# Patient Record
Sex: Male | Born: 1995 | Race: White | Hispanic: Yes | Marital: Single | Smoking: Never smoker
Health system: Southern US, Community
[De-identification: ages and names within clinical notes are randomized; demographics above are authoritative.]

## PROBLEM LIST (undated history)

## (undated) DIAGNOSIS — Q780 Osteogenesis imperfecta: Secondary | ICD-10-CM

## (undated) DIAGNOSIS — S83004A Unspecified dislocation of right patella, initial encounter: Secondary | ICD-10-CM

## (undated) DIAGNOSIS — S82209A Unspecified fracture of shaft of unspecified tibia, initial encounter for closed fracture: Secondary | ICD-10-CM

## (undated) DIAGNOSIS — S42409A Unspecified fracture of lower end of unspecified humerus, initial encounter for closed fracture: Secondary | ICD-10-CM

## (undated) DIAGNOSIS — R062 Wheezing: Secondary | ICD-10-CM

## (undated) DIAGNOSIS — S8264XA Nondisplaced fracture of lateral malleolus of right fibula, initial encounter for closed fracture: Secondary | ICD-10-CM

## (undated) DIAGNOSIS — Z8669 Personal history of other diseases of the nervous system and sense organs: Secondary | ICD-10-CM

## (undated) DIAGNOSIS — S82409A Unspecified fracture of shaft of unspecified fibula, initial encounter for closed fracture: Secondary | ICD-10-CM

## (undated) HISTORY — DX: Unspecified dislocation of right patella, initial encounter: S83.004A

## (undated) HISTORY — DX: Unspecified fracture of shaft of unspecified fibula, initial encounter for closed fracture: S82.409A

## (undated) HISTORY — DX: Personal history of other diseases of the nervous system and sense organs: Z86.69

## (undated) HISTORY — DX: Unspecified fracture of lower end of unspecified humerus, initial encounter for closed fracture: S42.409A

## (undated) HISTORY — DX: Nondisplaced fracture of lateral malleolus of right fibula, initial encounter for closed fracture: S82.64XA

## (undated) HISTORY — DX: Osteogenesis imperfecta: Q78.0

## (undated) HISTORY — DX: Wheezing: R06.2

## (undated) HISTORY — DX: Unspecified fracture of shaft of unspecified fibula, initial encounter for closed fracture: S82.209A

## (undated) HISTORY — PX: ORIF TIBIA & FIBULA FRACTURES: SHX2131

---
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
## 2017-01-11 HISTORY — PX: MEDIAL PATELLOFEMORAL LIGAMENT REPAIR: SHX2020

## 2017-01-17 ENCOUNTER — Encounter: Payer: Self-pay | Admitting: Family Medicine

## 2017-01-17 ENCOUNTER — Ambulatory Visit (INDEPENDENT_AMBULATORY_CARE_PROVIDER_SITE_OTHER)
Admission: RE | Admit: 2017-01-17 | Discharge: 2017-01-17 | Disposition: A | Discharge status: Home / Self Care | Payer: Commercial Managed Care - PPO | Source: Ambulatory Visit | Attending: Family Medicine | Admitting: Family Medicine

## 2017-01-17 ENCOUNTER — Other Ambulatory Visit: Payer: Self-pay

## 2017-01-17 ENCOUNTER — Ambulatory Visit: Payer: Commercial Managed Care - PPO | Admitting: Family Medicine

## 2017-01-17 VITALS — BP 110/72 | HR 84 | Temp 98.3°F | Ht 66.75 in | Wt 173.5 lb

## 2017-01-17 DIAGNOSIS — Q780 Osteogenesis imperfecta: Secondary | ICD-10-CM

## 2017-01-17 DIAGNOSIS — M25511 Pain in right shoulder: Secondary | ICD-10-CM

## 2017-01-17 NOTE — Progress Notes (Signed)
Dr. Karleen HampshireSpencer T. Dreshaun Stene, MD, CAQ Sports Medicine Primary Care and Sports Medicine [address] [place], [id number] Phone: [phone] Fax: [phone]  01/17/2017  Patient: Arthur Fleming, MRN: [id number], DOB: 04/02/1995, 22 y.o.  Primary Physician:  Hannah Beatopland, Joliana Claflin, MD   Chief Complaint  Patient presents with  . Shoulder Pain    Right-Power Lifter   Subjective:   Arthur Fleming is a 22 y.o. very pleasant male patient who presents with the following:  Started to lift - felt pain, bruise in posterior delt.  Overhead presses working well.  He only gave himself about 5 days off of pressing movements.  He subsequently developed pain and some bruising in his upper lap on the right side as well.  He has been able to continue to lift weights including dead lift, back squats, overhead presses, and almost everything except for frontal raises as well as bench press.  Past Medical History, Surgical History, Social History, Family History, Problem List, Medications, and Allergies have been reviewed and updated if relevant.  Patient Active Problem List   Diagnosis Date Noted  . 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 CLOSED FRACTURE OF SHAFT OF TIBIA 04/07/2009  . ALLERGIC RHINITIS 05/02/2008  . OPEN FRACTURE OF OLECRANON PROCESS OF ULNA 10/15/2007  . OSTEOPENIA 02/07/2007    Past Medical History:  Diagnosis Date  . Elbow fracture 2010  . H/O: chronic ear infection   . Osteogenesis imperfecta   . Tibia fracture 03-2009  . Tibia/fibula fracture    3,4  . Wheezing without diagnosis of asthma     Past Surgical History:  Procedure Laterality Date  . ELBOW FRACTURE SURGERY  10-16-2007   LEFT   . ORIF TIBIA & FIBULA FRACTURES     3 TIMES AS YOUNG CHILD (3)    Social History   Socioeconomic History  . Marital status: Single    Spouse name: Not on file  . Number of children: Not on file  . Years of education: Not on file  . Highest education level:  Not on file  Social Needs  . Financial resource strain: Not on file  . Food insecurity - worry: Not on file  . Food insecurity - inability: Not on file  . Transportation needs - medical: Not on file  . Transportation needs - non-medical: Not on file  Occupational History  . Not on file  Tobacco Use  . Smoking status: Never Smoker  . Smokeless tobacco: Never Used  Substance and Sexual Activity  . Alcohol use: No    Alcohol/week: 0.0 oz  . Drug use: No  . Sexual activity: Not on file  Other Topics Concern  . Not on file  Social History Narrative   Lives with adopted parents, he is a native of [place], adopted at 224 [place] 1/2 months    No family history on file.  Allergies  Allergen Reactions  . Amoxicillin-Pot Clavulanate     REACTION: diarrhea    Medication list reviewed and updated in full in [facility] [facility].  GEN: No fevers, chills. Nontoxic. Primarily MSK c/o today. MSK: Detailed in the HPI GI: tolerating PO intake without difficulty Neuro: No numbness, parasthesias, or tingling associated. Otherwise the pertinent positives of the ROS are noted above.   Objective:   BP 110/72   Pulse 84   Temp 98.3 F (36.8 C) (Oral)   Ht 5' 6.75" (1.695 m)   Wt 173 lb 8 oz (78.7 kg)  BMI 27.38 kg/m    GEN: WDWN, NAD, Non-toxic, Alert & Oriented x 3 HEENT: Atraumatic, Normocephalic.  Ears and Nose: No external deformity. EXTR: No clubbing/cyanosis/edema NEURO: Normal gait.  PSYCH: Normally interactive. Conversant. Not depressed or anxious appearing.  Calm demeanor.   Shoulder: R Inspection: No muscle wasting or winging Ecchymosis/edema: neg  AC joint, scapula, clavicle: mild ttp at ac joint Cervical spine: NT, full ROM Spurling's: neg Abduction: full, 5/5 Flexion: full, 4+/5 and causes some pain IR, full, lift-off: 5/5 ER at neutral: full, 5/5 AC crossover and compression: neg Neer: neg Hawkins: neg Drop Test: neg Empty Can: neg Supraspinatus insertion:  NT Bicipital groove: NT Speed's: neg Yergason's: neg Sulcus sign: neg Scapular dyskinesis: none C5-T1 intact Sensation intact Grip 5/5   Radiology: Dg Shoulder Right  Result Date: 01/17/2017 CLINICAL DATA:  Right shoulder pain near the [facility] joint following lifting injury 1 month ago. EXAM: RIGHT SHOULDER - 2+ VIEW COMPARISON:  None. FINDINGS: The mineralization and alignment are normal. There is no evidence of acute fracture or dislocation. Possible minimal lucency in the distal clavicle without AC joint widening or surrounding soft tissue abnormality. The subacromial space is preserved. IMPRESSION: Possible minimal lucency in the distal clavicle which could reflect posttraumatic osteolysis. No AC joint widening or acute fracture. Electronically Signed   By: Carey Bullocks M.D.   On: 01/17/2017 16:24     Assessment and Plan:   Acute pain of right shoulder - Plan: DG Shoulder Right  Osteogenesis imperfecta - Plan: DG Shoulder Right  >25 minutes spent in face to face time with patient, >50% spent in counselling or coordination of care   The patient is lifting very heavy weights.  He has done well and not had fractures for many years.  I verbally discussed the case with radiology, and they do not think that there is any sign for acute or prior fracture.  I suspect he had an injury followed by another injury where he pulled his upper LAT and then subsequently strained his AC joint.  He has been improving, but I think you return to sport too early.  Recommend no pressing movements for at least 2 weeks, then gradual return with lower weights as pain allows.  Other weight lifting movements can be done depending on if they induce pain.  He understands.  Follow-up: No Follow-up on file.  Medications Discontinued During This Encounter  Medication Reason  . loratadine (CLARITIN) 10 MG tablet Completed Course   Orders Placed This Encounter  Procedures  . DG Shoulder Right     Signed,  Karleen Hampshire T. Nile Prisk, MD   Allergies as of 01/17/2017      Reactions   Amoxicillin-pot Clavulanate    REACTION: diarrhea      Medication List    as of 01/17/2017 11:59 PM   You have not been prescribed any medications.

## 2017-01-18 ENCOUNTER — Encounter: Payer: Self-pay | Admitting: Family Medicine

## 2017-02-17 DIAGNOSIS — J101 Influenza due to other identified influenza virus with other respiratory manifestations: Secondary | ICD-10-CM | POA: Diagnosis not present

## 2017-02-17 DIAGNOSIS — R6883 Chills (without fever): Secondary | ICD-10-CM | POA: Diagnosis not present

## 2017-07-10 DIAGNOSIS — M25561 Pain in right knee: Secondary | ICD-10-CM | POA: Diagnosis not present

## 2017-07-10 DIAGNOSIS — M25461 Effusion, right knee: Secondary | ICD-10-CM | POA: Diagnosis not present

## 2017-07-10 DIAGNOSIS — S8991XA Unspecified injury of right lower leg, initial encounter: Secondary | ICD-10-CM | POA: Diagnosis not present

## 2017-07-10 DIAGNOSIS — S8981XA Other specified injuries of right lower leg, initial encounter: Secondary | ICD-10-CM | POA: Diagnosis not present

## 2017-10-09 DIAGNOSIS — S82831A Other fracture of upper and lower end of right fibula, initial encounter for closed fracture: Secondary | ICD-10-CM | POA: Diagnosis not present

## 2017-10-09 DIAGNOSIS — M25461 Effusion, right knee: Secondary | ICD-10-CM | POA: Diagnosis not present

## 2017-10-09 DIAGNOSIS — R296 Repeated falls: Secondary | ICD-10-CM | POA: Diagnosis not present

## 2017-10-09 DIAGNOSIS — S8391XA Sprain of unspecified site of right knee, initial encounter: Secondary | ICD-10-CM | POA: Diagnosis not present

## 2017-10-09 DIAGNOSIS — S8261XA Displaced fracture of lateral malleolus of right fibula, initial encounter for closed fracture: Secondary | ICD-10-CM | POA: Diagnosis not present

## 2017-10-10 ENCOUNTER — Other Ambulatory Visit: Payer: Self-pay

## 2017-10-10 MED ORDER — ACETAMINOPHEN-CODEINE #3 300-30 MG PO TABS: 1.0000 | ORAL_TABLET | Freq: Four times a day (QID) | ORAL | 0 refills | Status: DC | PRN

## 2017-10-10 NOTE — Telephone Encounter (Signed)
Copied from CRM [id number]. Topic: General - Other >> Oct 10, 2017  1:17 PM Gaynelle Adu wrote: Reason for CRM: patient  mother is calling to advise the patient was seen at the ER yesterday morning, he is pain and  [place] of [place] in [place]. His father is going to pick him up today.  They will be taking him to [facility] to be seen. She is requesting some ype of pain medication until he can be seen.   [facility] [address] [place] - [address] [phone] (Phone) [phone] (Fax)    Best contact number [phone].

## 2017-10-10 NOTE — Telephone Encounter (Signed)
I spoke with pts mom; pt having pain in lt knee; fell while walking dog and seen in [place] ED; pt was told to take tylenol and see ortho by Tues. pts father is picking up pt now; lt knee is very swollen and bruised and request pain med stronger than tylenol until can see ortho at [facility]. pts mom said will go to [facility] or ED if needed this evening. pts mom request cb[facility] [place] Pts mom request cb. Pt last seen 01/17/17.

## 2017-10-10 NOTE — Telephone Encounter (Signed)
Rawn has OI. Concern for fracture.  Agree with [facility]  Tylenol #3, 1 po q 4-6 hours prn pain, #20, 0 ref  This is the same as the liquid he used to get when he was younger

## 2017-10-10 NOTE — Telephone Encounter (Signed)
Mrs. Persing notified by telephone that Dr. Patsy Lager has sent Casimiro Needle in a [facility] [place]  She said she would keep [place] posted with what they find out tomorrow at Ortho.

## 2017-10-11 ENCOUNTER — Telehealth: Payer: Self-pay

## 2017-10-11 DIAGNOSIS — S8264XA Nondisplaced fracture of lateral malleolus of right fibula, initial encounter for closed fracture: Secondary | ICD-10-CM | POA: Diagnosis not present

## 2017-10-11 DIAGNOSIS — M25579 Pain in unspecified ankle and joints of unspecified foot: Secondary | ICD-10-CM | POA: Diagnosis not present

## 2017-10-11 DIAGNOSIS — M25571 Pain in right ankle and joints of right foot: Secondary | ICD-10-CM | POA: Diagnosis not present

## 2017-10-11 DIAGNOSIS — M25569 Pain in unspecified knee: Secondary | ICD-10-CM | POA: Diagnosis not present

## 2017-10-11 DIAGNOSIS — S83011A Lateral subluxation of right patella, initial encounter: Secondary | ICD-10-CM | POA: Diagnosis not present

## 2017-10-11 DIAGNOSIS — M25461 Effusion, right knee: Secondary | ICD-10-CM | POA: Diagnosis not present

## 2017-10-11 DIAGNOSIS — S82891A Other fracture of right lower leg, initial encounter for closed fracture: Secondary | ICD-10-CM | POA: Diagnosis not present

## 2017-10-11 DIAGNOSIS — S8991XA Unspecified injury of right lower leg, initial encounter: Secondary | ICD-10-CM | POA: Diagnosis not present

## 2017-10-11 NOTE — Telephone Encounter (Signed)
Copied from CRM [id number]. Topic: Quick Communication - See Telephone Encounter >> Oct 11, 2017  4:06 PM Herby Abraham C wrote: CRM for notification. See Telephone encounter for: 10/11/17.   Pt's mom called in to make provider aware that pt was seen by ortho today and pt has fracture on ankle and knee. [facility] would like to see pt again tomorrow. [facility]  prescribed oxy for pt.

## 2017-10-12 DIAGNOSIS — M25561 Pain in right knee: Secondary | ICD-10-CM | POA: Diagnosis not present

## 2017-10-12 DIAGNOSIS — M948X6 Other specified disorders of cartilage, lower leg: Secondary | ICD-10-CM | POA: Diagnosis not present

## 2017-10-12 DIAGNOSIS — S83011A Lateral subluxation of right patella, initial encounter: Secondary | ICD-10-CM | POA: Diagnosis not present

## 2017-10-12 NOTE — Telephone Encounter (Signed)
ok 

## 2017-10-14 DIAGNOSIS — S83001A Unspecified subluxation of right patella, initial encounter: Secondary | ICD-10-CM | POA: Diagnosis not present

## 2017-10-14 DIAGNOSIS — M25461 Effusion, right knee: Secondary | ICD-10-CM | POA: Diagnosis not present

## 2017-10-20 DIAGNOSIS — S83004D Unspecified dislocation of right patella, subsequent encounter: Secondary | ICD-10-CM | POA: Diagnosis not present

## 2017-10-20 DIAGNOSIS — S8264XA Nondisplaced fracture of lateral malleolus of right fibula, initial encounter for closed fracture: Secondary | ICD-10-CM | POA: Diagnosis not present

## 2017-10-20 DIAGNOSIS — S8264XD Nondisplaced fracture of lateral malleolus of right fibula, subsequent encounter for closed fracture with routine healing: Secondary | ICD-10-CM | POA: Diagnosis not present

## 2017-10-20 DIAGNOSIS — S8261XA Displaced fracture of lateral malleolus of right fibula, initial encounter for closed fracture: Secondary | ICD-10-CM | POA: Diagnosis not present

## 2017-10-25 NOTE — Telephone Encounter (Signed)
Mom called to advise pt is having knee surgery this Thursday 10/27/17 at [facility] w/ Dr Ivin Poot. Wanted Dr Patsy Lager know.

## 2017-10-25 NOTE — Telephone Encounter (Signed)
Thanks for the update

## 2017-10-27 DIAGNOSIS — S83004A Unspecified dislocation of right patella, initial encounter: Secondary | ICD-10-CM | POA: Diagnosis not present

## 2017-10-27 DIAGNOSIS — S83006A Unspecified dislocation of unspecified patella, initial encounter: Secondary | ICD-10-CM | POA: Diagnosis not present

## 2017-10-27 DIAGNOSIS — Z880 Allergy status to penicillin: Secondary | ICD-10-CM | POA: Diagnosis not present

## 2017-10-28 DIAGNOSIS — S83004A Unspecified dislocation of right patella, initial encounter: Secondary | ICD-10-CM | POA: Diagnosis not present

## 2017-10-28 DIAGNOSIS — Z880 Allergy status to penicillin: Secondary | ICD-10-CM | POA: Diagnosis not present

## 2017-11-03 DIAGNOSIS — S83001A Unspecified subluxation of right patella, initial encounter: Secondary | ICD-10-CM | POA: Diagnosis not present

## 2017-11-04 ENCOUNTER — Telehealth: Payer: Self-pay

## 2017-11-04 DIAGNOSIS — S82899S Other fracture of unspecified lower leg, sequela: Secondary | ICD-10-CM

## 2017-11-04 NOTE — Telephone Encounter (Signed)
Copied from CRM [id number]. Topic: Referral - Request for Referral >> Nov 04, 2017  9:43 AM Arlyss Gandy, NT wrote: Has patient seen PCP for this complaint? Yes.   *If NO, is insurance requiring patient see PCP for this issue before PCP can refer them? Referral for which specialty: physical therapy Preferred provider/office: unsure but requesting somewhere in [place] Reason for referral: For ankle fractures and knee surgery

## 2017-11-07 ENCOUNTER — Telehealth: Payer: Self-pay

## 2017-11-07 NOTE — Telephone Encounter (Signed)
Called Arthur Fleming back and left her my direct phone number to call me back.

## 2017-11-07 NOTE — Telephone Encounter (Signed)
Copied from CRM [id number]. Topic: Quick Communication - See Telephone Encounter >> Nov 07, 2017  9:24 AM Aretta Nip wrote: CRM for notification. See Telephone encounter for: 11/07/17. Mother of patient was speaking with Danella Maiers this morning about a referral for Arthur Fleming, lost her info about the time and place, address of the appointment.

## 2017-11-11 DIAGNOSIS — M25661 Stiffness of right knee, not elsewhere classified: Secondary | ICD-10-CM | POA: Diagnosis not present

## 2017-11-11 DIAGNOSIS — M25671 Stiffness of right ankle, not elsewhere classified: Secondary | ICD-10-CM | POA: Diagnosis not present

## 2017-11-11 DIAGNOSIS — M6281 Muscle weakness (generalized): Secondary | ICD-10-CM | POA: Diagnosis not present

## 2017-11-17 DIAGNOSIS — M25661 Stiffness of right knee, not elsewhere classified: Secondary | ICD-10-CM | POA: Diagnosis not present

## 2017-11-17 DIAGNOSIS — M25671 Stiffness of right ankle, not elsewhere classified: Secondary | ICD-10-CM | POA: Diagnosis not present

## 2017-11-17 DIAGNOSIS — M6281 Muscle weakness (generalized): Secondary | ICD-10-CM | POA: Diagnosis not present

## 2017-11-23 DIAGNOSIS — M6281 Muscle weakness (generalized): Secondary | ICD-10-CM | POA: Diagnosis not present

## 2017-11-23 DIAGNOSIS — M25671 Stiffness of right ankle, not elsewhere classified: Secondary | ICD-10-CM | POA: Diagnosis not present

## 2017-11-23 DIAGNOSIS — M25661 Stiffness of right knee, not elsewhere classified: Secondary | ICD-10-CM | POA: Diagnosis not present

## 2017-11-25 DIAGNOSIS — M25661 Stiffness of right knee, not elsewhere classified: Secondary | ICD-10-CM | POA: Diagnosis not present

## 2017-11-25 DIAGNOSIS — M25671 Stiffness of right ankle, not elsewhere classified: Secondary | ICD-10-CM | POA: Diagnosis not present

## 2017-11-25 DIAGNOSIS — M6281 Muscle weakness (generalized): Secondary | ICD-10-CM | POA: Diagnosis not present

## 2017-11-28 DIAGNOSIS — Z9889 Other specified postprocedural states: Secondary | ICD-10-CM | POA: Diagnosis not present

## 2017-11-28 DIAGNOSIS — S83004D Unspecified dislocation of right patella, subsequent encounter: Secondary | ICD-10-CM | POA: Diagnosis not present

## 2017-11-28 DIAGNOSIS — S8261XD Displaced fracture of lateral malleolus of right fibula, subsequent encounter for closed fracture with routine healing: Secondary | ICD-10-CM | POA: Diagnosis not present

## 2017-11-28 DIAGNOSIS — S83004A Unspecified dislocation of right patella, initial encounter: Secondary | ICD-10-CM | POA: Diagnosis not present

## 2017-11-28 DIAGNOSIS — S8264XA Nondisplaced fracture of lateral malleolus of right fibula, initial encounter for closed fracture: Secondary | ICD-10-CM | POA: Diagnosis not present

## 2017-11-28 DIAGNOSIS — S8264XD Nondisplaced fracture of lateral malleolus of right fibula, subsequent encounter for closed fracture with routine healing: Secondary | ICD-10-CM | POA: Diagnosis not present

## 2017-11-29 DIAGNOSIS — M6281 Muscle weakness (generalized): Secondary | ICD-10-CM | POA: Diagnosis not present

## 2017-11-29 DIAGNOSIS — M25671 Stiffness of right ankle, not elsewhere classified: Secondary | ICD-10-CM | POA: Diagnosis not present

## 2017-11-29 DIAGNOSIS — M25661 Stiffness of right knee, not elsewhere classified: Secondary | ICD-10-CM | POA: Diagnosis not present

## 2017-12-01 DIAGNOSIS — M25671 Stiffness of right ankle, not elsewhere classified: Secondary | ICD-10-CM | POA: Diagnosis not present

## 2017-12-01 DIAGNOSIS — M6281 Muscle weakness (generalized): Secondary | ICD-10-CM | POA: Diagnosis not present

## 2017-12-01 DIAGNOSIS — M25661 Stiffness of right knee, not elsewhere classified: Secondary | ICD-10-CM | POA: Diagnosis not present

## 2017-12-07 DIAGNOSIS — M25671 Stiffness of right ankle, not elsewhere classified: Secondary | ICD-10-CM | POA: Diagnosis not present

## 2017-12-07 DIAGNOSIS — M25661 Stiffness of right knee, not elsewhere classified: Secondary | ICD-10-CM | POA: Diagnosis not present

## 2017-12-07 DIAGNOSIS — M6281 Muscle weakness (generalized): Secondary | ICD-10-CM | POA: Diagnosis not present

## 2017-12-12 DIAGNOSIS — M6281 Muscle weakness (generalized): Secondary | ICD-10-CM | POA: Diagnosis not present

## 2017-12-12 DIAGNOSIS — M25661 Stiffness of right knee, not elsewhere classified: Secondary | ICD-10-CM | POA: Diagnosis not present

## 2017-12-12 DIAGNOSIS — M25671 Stiffness of right ankle, not elsewhere classified: Secondary | ICD-10-CM | POA: Diagnosis not present

## 2017-12-14 DIAGNOSIS — M25671 Stiffness of right ankle, not elsewhere classified: Secondary | ICD-10-CM | POA: Diagnosis not present

## 2017-12-14 DIAGNOSIS — M25661 Stiffness of right knee, not elsewhere classified: Secondary | ICD-10-CM | POA: Diagnosis not present

## 2017-12-14 DIAGNOSIS — M6281 Muscle weakness (generalized): Secondary | ICD-10-CM | POA: Diagnosis not present

## 2017-12-21 DIAGNOSIS — M25671 Stiffness of right ankle, not elsewhere classified: Secondary | ICD-10-CM | POA: Diagnosis not present

## 2017-12-21 DIAGNOSIS — M25661 Stiffness of right knee, not elsewhere classified: Secondary | ICD-10-CM | POA: Diagnosis not present

## 2017-12-21 DIAGNOSIS — M6281 Muscle weakness (generalized): Secondary | ICD-10-CM | POA: Diagnosis not present

## 2017-12-23 DIAGNOSIS — M25661 Stiffness of right knee, not elsewhere classified: Secondary | ICD-10-CM | POA: Diagnosis not present

## 2017-12-23 DIAGNOSIS — M6281 Muscle weakness (generalized): Secondary | ICD-10-CM | POA: Diagnosis not present

## 2017-12-23 DIAGNOSIS — M25671 Stiffness of right ankle, not elsewhere classified: Secondary | ICD-10-CM | POA: Diagnosis not present

## 2017-12-26 DIAGNOSIS — M25661 Stiffness of right knee, not elsewhere classified: Secondary | ICD-10-CM | POA: Diagnosis not present

## 2017-12-26 DIAGNOSIS — M6281 Muscle weakness (generalized): Secondary | ICD-10-CM | POA: Diagnosis not present

## 2017-12-26 DIAGNOSIS — M25671 Stiffness of right ankle, not elsewhere classified: Secondary | ICD-10-CM | POA: Diagnosis not present

## 2017-12-28 DIAGNOSIS — M25671 Stiffness of right ankle, not elsewhere classified: Secondary | ICD-10-CM | POA: Diagnosis not present

## 2017-12-28 DIAGNOSIS — M6281 Muscle weakness (generalized): Secondary | ICD-10-CM | POA: Diagnosis not present

## 2017-12-28 DIAGNOSIS — M25661 Stiffness of right knee, not elsewhere classified: Secondary | ICD-10-CM | POA: Diagnosis not present

## 2018-01-03 DIAGNOSIS — M25661 Stiffness of right knee, not elsewhere classified: Secondary | ICD-10-CM | POA: Diagnosis not present

## 2018-01-03 DIAGNOSIS — M25671 Stiffness of right ankle, not elsewhere classified: Secondary | ICD-10-CM | POA: Diagnosis not present

## 2018-01-03 DIAGNOSIS — M6281 Muscle weakness (generalized): Secondary | ICD-10-CM | POA: Diagnosis not present

## 2018-01-05 DIAGNOSIS — M25671 Stiffness of right ankle, not elsewhere classified: Secondary | ICD-10-CM | POA: Diagnosis not present

## 2018-01-05 DIAGNOSIS — M25661 Stiffness of right knee, not elsewhere classified: Secondary | ICD-10-CM | POA: Diagnosis not present

## 2018-01-05 DIAGNOSIS — M6281 Muscle weakness (generalized): Secondary | ICD-10-CM | POA: Diagnosis not present

## 2018-01-13 DIAGNOSIS — M6281 Muscle weakness (generalized): Secondary | ICD-10-CM | POA: Diagnosis not present

## 2018-01-13 DIAGNOSIS — M25671 Stiffness of right ankle, not elsewhere classified: Secondary | ICD-10-CM | POA: Diagnosis not present

## 2018-01-13 DIAGNOSIS — M25661 Stiffness of right knee, not elsewhere classified: Secondary | ICD-10-CM | POA: Diagnosis not present

## 2018-01-16 DIAGNOSIS — M25661 Stiffness of right knee, not elsewhere classified: Secondary | ICD-10-CM | POA: Diagnosis not present

## 2018-01-16 DIAGNOSIS — M6281 Muscle weakness (generalized): Secondary | ICD-10-CM | POA: Diagnosis not present

## 2018-01-16 DIAGNOSIS — M25671 Stiffness of right ankle, not elsewhere classified: Secondary | ICD-10-CM | POA: Diagnosis not present

## 2018-01-18 DIAGNOSIS — M25661 Stiffness of right knee, not elsewhere classified: Secondary | ICD-10-CM | POA: Diagnosis not present

## 2018-01-18 DIAGNOSIS — M25671 Stiffness of right ankle, not elsewhere classified: Secondary | ICD-10-CM | POA: Diagnosis not present

## 2018-01-18 DIAGNOSIS — M6281 Muscle weakness (generalized): Secondary | ICD-10-CM | POA: Diagnosis not present

## 2018-02-06 DIAGNOSIS — S83004D Unspecified dislocation of right patella, subsequent encounter: Secondary | ICD-10-CM | POA: Diagnosis not present

## 2018-02-06 DIAGNOSIS — S8264XA Nondisplaced fracture of lateral malleolus of right fibula, initial encounter for closed fracture: Secondary | ICD-10-CM | POA: Diagnosis not present

## 2018-02-13 DIAGNOSIS — M25561 Pain in right knee: Secondary | ICD-10-CM | POA: Diagnosis not present

## 2018-02-13 DIAGNOSIS — M25571 Pain in right ankle and joints of right foot: Secondary | ICD-10-CM | POA: Diagnosis not present

## 2018-02-16 DIAGNOSIS — M25561 Pain in right knee: Secondary | ICD-10-CM | POA: Diagnosis not present

## 2018-02-16 DIAGNOSIS — M25571 Pain in right ankle and joints of right foot: Secondary | ICD-10-CM | POA: Diagnosis not present

## 2018-02-21 DIAGNOSIS — M25561 Pain in right knee: Secondary | ICD-10-CM | POA: Diagnosis not present

## 2018-02-21 DIAGNOSIS — M25571 Pain in right ankle and joints of right foot: Secondary | ICD-10-CM | POA: Diagnosis not present

## 2018-02-23 DIAGNOSIS — M25571 Pain in right ankle and joints of right foot: Secondary | ICD-10-CM | POA: Diagnosis not present

## 2018-02-23 DIAGNOSIS — M25561 Pain in right knee: Secondary | ICD-10-CM | POA: Diagnosis not present

## 2018-02-28 DIAGNOSIS — M25571 Pain in right ankle and joints of right foot: Secondary | ICD-10-CM | POA: Diagnosis not present

## 2018-02-28 DIAGNOSIS — M25561 Pain in right knee: Secondary | ICD-10-CM | POA: Diagnosis not present

## 2018-03-02 DIAGNOSIS — M25571 Pain in right ankle and joints of right foot: Secondary | ICD-10-CM | POA: Diagnosis not present

## 2018-03-02 DIAGNOSIS — M25561 Pain in right knee: Secondary | ICD-10-CM | POA: Diagnosis not present

## 2018-03-16 DIAGNOSIS — M25561 Pain in right knee: Secondary | ICD-10-CM | POA: Diagnosis not present

## 2018-03-16 DIAGNOSIS — M25571 Pain in right ankle and joints of right foot: Secondary | ICD-10-CM | POA: Diagnosis not present

## 2018-04-25 DIAGNOSIS — S8264XD Nondisplaced fracture of lateral malleolus of right fibula, subsequent encounter for closed fracture with routine healing: Secondary | ICD-10-CM | POA: Diagnosis not present

## 2018-04-25 DIAGNOSIS — S83004D Unspecified dislocation of right patella, subsequent encounter: Secondary | ICD-10-CM | POA: Diagnosis not present

## 2018-04-26 DIAGNOSIS — S8264XD Nondisplaced fracture of lateral malleolus of right fibula, subsequent encounter for closed fracture with routine healing: Secondary | ICD-10-CM | POA: Diagnosis not present

## 2018-04-26 DIAGNOSIS — S83004D Unspecified dislocation of right patella, subsequent encounter: Secondary | ICD-10-CM | POA: Diagnosis not present

## 2018-05-01 DIAGNOSIS — S8264XD Nondisplaced fracture of lateral malleolus of right fibula, subsequent encounter for closed fracture with routine healing: Secondary | ICD-10-CM | POA: Diagnosis not present

## 2018-05-01 DIAGNOSIS — S83004D Unspecified dislocation of right patella, subsequent encounter: Secondary | ICD-10-CM | POA: Diagnosis not present

## 2018-05-03 DIAGNOSIS — S8264XD Nondisplaced fracture of lateral malleolus of right fibula, subsequent encounter for closed fracture with routine healing: Secondary | ICD-10-CM | POA: Diagnosis not present

## 2018-05-03 DIAGNOSIS — S83004D Unspecified dislocation of right patella, subsequent encounter: Secondary | ICD-10-CM | POA: Diagnosis not present

## 2018-05-08 DIAGNOSIS — S83004D Unspecified dislocation of right patella, subsequent encounter: Secondary | ICD-10-CM | POA: Diagnosis not present

## 2018-05-08 DIAGNOSIS — S8264XD Nondisplaced fracture of lateral malleolus of right fibula, subsequent encounter for closed fracture with routine healing: Secondary | ICD-10-CM | POA: Diagnosis not present

## 2018-05-10 DIAGNOSIS — S8264XD Nondisplaced fracture of lateral malleolus of right fibula, subsequent encounter for closed fracture with routine healing: Secondary | ICD-10-CM | POA: Diagnosis not present

## 2018-05-10 DIAGNOSIS — S83004D Unspecified dislocation of right patella, subsequent encounter: Secondary | ICD-10-CM | POA: Diagnosis not present

## 2018-05-12 DIAGNOSIS — S83004D Unspecified dislocation of right patella, subsequent encounter: Secondary | ICD-10-CM | POA: Diagnosis not present

## 2018-05-12 DIAGNOSIS — S82892D Other fracture of left lower leg, subsequent encounter for closed fracture with routine healing: Secondary | ICD-10-CM | POA: Diagnosis not present

## 2018-05-15 DIAGNOSIS — S8264XD Nondisplaced fracture of lateral malleolus of right fibula, subsequent encounter for closed fracture with routine healing: Secondary | ICD-10-CM | POA: Diagnosis not present

## 2018-05-15 DIAGNOSIS — S83004D Unspecified dislocation of right patella, subsequent encounter: Secondary | ICD-10-CM | POA: Diagnosis not present

## 2018-05-17 DIAGNOSIS — S8264XD Nondisplaced fracture of lateral malleolus of right fibula, subsequent encounter for closed fracture with routine healing: Secondary | ICD-10-CM | POA: Diagnosis not present

## 2018-05-17 DIAGNOSIS — S83004D Unspecified dislocation of right patella, subsequent encounter: Secondary | ICD-10-CM | POA: Diagnosis not present

## 2018-05-18 ENCOUNTER — Other Ambulatory Visit: Payer: Self-pay | Admitting: *Deleted

## 2018-05-18 ENCOUNTER — Ambulatory Visit: Payer: Commercial Managed Care - PPO | Admitting: Family Medicine

## 2018-05-18 ENCOUNTER — Other Ambulatory Visit: Payer: Self-pay

## 2018-05-18 ENCOUNTER — Encounter: Payer: Self-pay | Admitting: Family Medicine

## 2018-05-18 VITALS — BP 120/80 | HR 76 | Temp 98.8°F | Ht 66.5 in | Wt 189.0 lb

## 2018-05-18 DIAGNOSIS — W540XXA Bitten by dog, initial encounter: Secondary | ICD-10-CM | POA: Diagnosis not present

## 2018-05-18 DIAGNOSIS — S01451A Open bite of right cheek and temporomandibular area, initial encounter: Secondary | ICD-10-CM | POA: Diagnosis not present

## 2018-05-18 DIAGNOSIS — Z23 Encounter for immunization: Secondary | ICD-10-CM

## 2018-05-18 MED ORDER — AMOXICILLIN-POT CLAVULANATE 875-125 MG PO TABS: 1.0000 | ORAL_TABLET | Freq: Two times a day (BID) | ORAL | 0 refills | Status: AC

## 2018-05-18 NOTE — Progress Notes (Signed)
Arthur Darden T. Veria Stradley, MD Primary Care and Sports Medicine [facility] at [facility] [address] [place], [address] Phone: [phone]  FAX: [phone]  Arthur Fleming - 23 y.o. male  MRN [id number]  Date of Birth: 1995-07-17  Visit Date: 05/18/2018  PCP: Hannah Beat, MD  Referred by: Hannah Beat, MD  Chief Complaint  Patient presents with  . Animal Bite   Subjective:   Arthur Fleming is a 23 y.o. very pleasant male patient who presents with the following:  DOI 05/18/2018  Arthur Fleming is a well-known patient with osteogenesis imperfecta, and he presents today after a dog bite of the right face.  This was his family's dog, and he is very well taken care of.  Arthur Fleming tells me that he actually went to the veterinarian's office last week, and he is fully up-to-date on all of his shots including rabies.  He does need a tetanus shot.  It is currently bleeding a little bit, but he does not really hurt significantly.  Past Medical History, Surgical History, Social History, Family History, Problem List, Medications, and Allergies have been reviewed and updated if relevant.  Patient Active Problem List   Diagnosis Date Noted  . 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 R patellar dislocation   . Nondisplaced lateral malleolus R fibula, 2019   . CLOSED FRACTURE OF SHAFT OF TIBIA 04/07/2009  . ALLERGIC RHINITIS 05/02/2008  . OPEN FRACTURE OF OLECRANON PROCESS OF ULNA 10/15/2007  . OSTEOPENIA 02/07/2007    Past Medical History:  Diagnosis Date  . Elbow fracture 2010  . H/O: chronic ear infection   . Nondisplaced lateral malleolus R fibula, 2019    OR Dr. Ivin Poot, [facility]  . Osteogenesis imperfecta   . R patellar dislocation    s/p Dr. Ivin Poot Surgery, [facility]  . Tibia fracture 03-2009  . Tibia/fibula fracture    3,4  . Wheezing without diagnosis of asthma     Past Surgical History:  Procedure Laterality Date  . ELBOW FRACTURE  SURGERY  10-16-2007   LEFT   . MEDIAL PATELLOFEMORAL LIGAMENT REPAIR Right 2019   Dr. Ivin Poot, [facility]  . ORIF TIBIA & FIBULA FRACTURES     3 TIMES AS YOUNG CHILD (3)    Social History   Socioeconomic History  . Marital status: Single    Spouse name: Not on file  . Number of children: Not on file  . Years of education: Not on file  . Highest education level: Not on file  Occupational History  . Not on file  Social Needs  . Financial resource strain: Not on file  . Food insecurity:    Worry: Not on file    Inability: Not on file  . Transportation needs:    Medical: Not on file    Non-medical: Not on file  Tobacco Use  . Smoking status: Never Smoker  . Smokeless tobacco: Never Used  Substance and Sexual Activity  . Alcohol use: No    Alcohol/week: 0.0 standard drinks  . Drug use: No  . Sexual activity: Not on file  Lifestyle  . Physical activity:    Days per week: Not on file    Minutes per session: Not on file  . Stress: Not on file  Relationships  . Social connections:    Talks on phone: Not on file    Gets together: Not on file    Attends religious service: Not on file  Active member of club or organization: Not on file    Attends meetings of clubs or organizations: Not on file    Relationship status: Not on file  . Intimate partner violence:    Fear of current or ex partner: Not on file    Emotionally abused: Not on file    Physically abused: Not on file    Forced sexual activity: Not on file  Other Topics Concern  . Not on file  Social History Narrative   Lives with adopted parents   he is a native of [place]   adopted at 934 and 1/2 months   Student: [employer]   Avid Powerlifter    No family history on file.  No Active Allergies  Medication list reviewed and updated in full in [facility] [facility].   GEN: No acute illnesses, no fevers, chills. GI: No n/v/d, eating normally Pulm: No SOB Interactive and getting along well  at home.  Otherwise, ROS is as per the HPI.  Objective:   BP 120/80   Pulse 76   Temp 98.8 F (37.1 C) (Oral)   Ht 5' 6.5" (1.689 m)   Wt 189 lb (85.7 kg)   BMI 30.05 kg/m   GEN: WDWN, NAD, Non-toxic, A & O x 3 HEENT: Atraumatic, Normocephalic. Neck supple. No masses, No LAD. Ears and Nose: No external deformity. EXTR: No c/c/e NEURO Normal gait.  PSYCH: Normally interactive. Conversant. Not depressed or anxious appearing.  Calm demeanor.   He has multiple small puncture wounds, small laceration on the face, all less than 5 or 6 mm.  There is no significant pain on palpation.  He moves all the muscles in his face and eyes appropriately.  Laboratory and Imaging Data:  Assessment and Plan:   Dog bite, initial encounter - Plan: Tdap vaccine greater than or equal to 7yo IM  With a dog bite, this needs to remain open, placed him on Augmentin.  He understands if this worsens significantly at all with regards to pain, feverish changes, or involvement of the eye, then he needs to receive medical care urgently.  Follow-up: No follow-ups on file.  Meds ordered this encounter  Medications  . amoxicillin-clavulanate (AUGMENTIN) 875-125 MG tablet    Sig: Take 1 tablet by mouth 2 (two) times daily for 10 days.    Dispense:  20 tablet    Refill:  0   Orders Placed This Encounter  Procedures  . Tdap vaccine greater than or equal to 7yo IM    Signed,  Chanique Duca T. Bowen Kia, MD   Outpatient Encounter Medications as of 05/18/2018  Medication Sig  . amoxicillin-clavulanate (AUGMENTIN) 875-125 MG tablet Take 1 tablet by mouth 2 (two) times daily for 10 days.  . [DISCONTINUED] acetaminophen-codeine (TYLENOL #3) 300-30 MG tablet Take 1 tablet by mouth every 6 (six) hours as needed for moderate pain.   No facility-administered encounter medications on file as of 05/18/2018.

## 2018-05-18 NOTE — Patient Instructions (Signed)
Neosporin  After 4 or 5 days can leave to the air, and will scab  After it heals, MEDERMA can help with scar

## 2018-05-19 ENCOUNTER — Encounter: Payer: Self-pay | Admitting: Family Medicine

## 2018-05-19 DIAGNOSIS — S82209A Unspecified fracture of shaft of unspecified tibia, initial encounter for closed fracture: Secondary | ICD-10-CM | POA: Insufficient documentation

## 2018-05-19 DIAGNOSIS — S83004A Unspecified dislocation of right patella, initial encounter: Secondary | ICD-10-CM | POA: Insufficient documentation

## 2018-05-19 DIAGNOSIS — S8264XA Nondisplaced fracture of lateral malleolus of right fibula, initial encounter for closed fracture: Secondary | ICD-10-CM | POA: Insufficient documentation

## 2018-05-22 DIAGNOSIS — S8264XD Nondisplaced fracture of lateral malleolus of right fibula, subsequent encounter for closed fracture with routine healing: Secondary | ICD-10-CM | POA: Diagnosis not present

## 2018-05-22 DIAGNOSIS — S83004D Unspecified dislocation of right patella, subsequent encounter: Secondary | ICD-10-CM | POA: Diagnosis not present

## 2018-05-24 DIAGNOSIS — S8264XD Nondisplaced fracture of lateral malleolus of right fibula, subsequent encounter for closed fracture with routine healing: Secondary | ICD-10-CM | POA: Diagnosis not present

## 2018-05-24 DIAGNOSIS — S83004D Unspecified dislocation of right patella, subsequent encounter: Secondary | ICD-10-CM | POA: Diagnosis not present

## 2018-05-29 DIAGNOSIS — S83004D Unspecified dislocation of right patella, subsequent encounter: Secondary | ICD-10-CM | POA: Diagnosis not present

## 2018-05-29 DIAGNOSIS — S8264XD Nondisplaced fracture of lateral malleolus of right fibula, subsequent encounter for closed fracture with routine healing: Secondary | ICD-10-CM | POA: Diagnosis not present

## 2018-10-14 IMAGING — DX DG SHOULDER 2+V*R*
3 series · 3 of 3 positions shown · non-contrast
Comparison: None.

CLINICAL DATA: Right shoulder pain near the AC joint following
lifting injury 1 month ago.

EXAM:
RIGHT SHOULDER - 2+ VIEW

[shoulder axial]
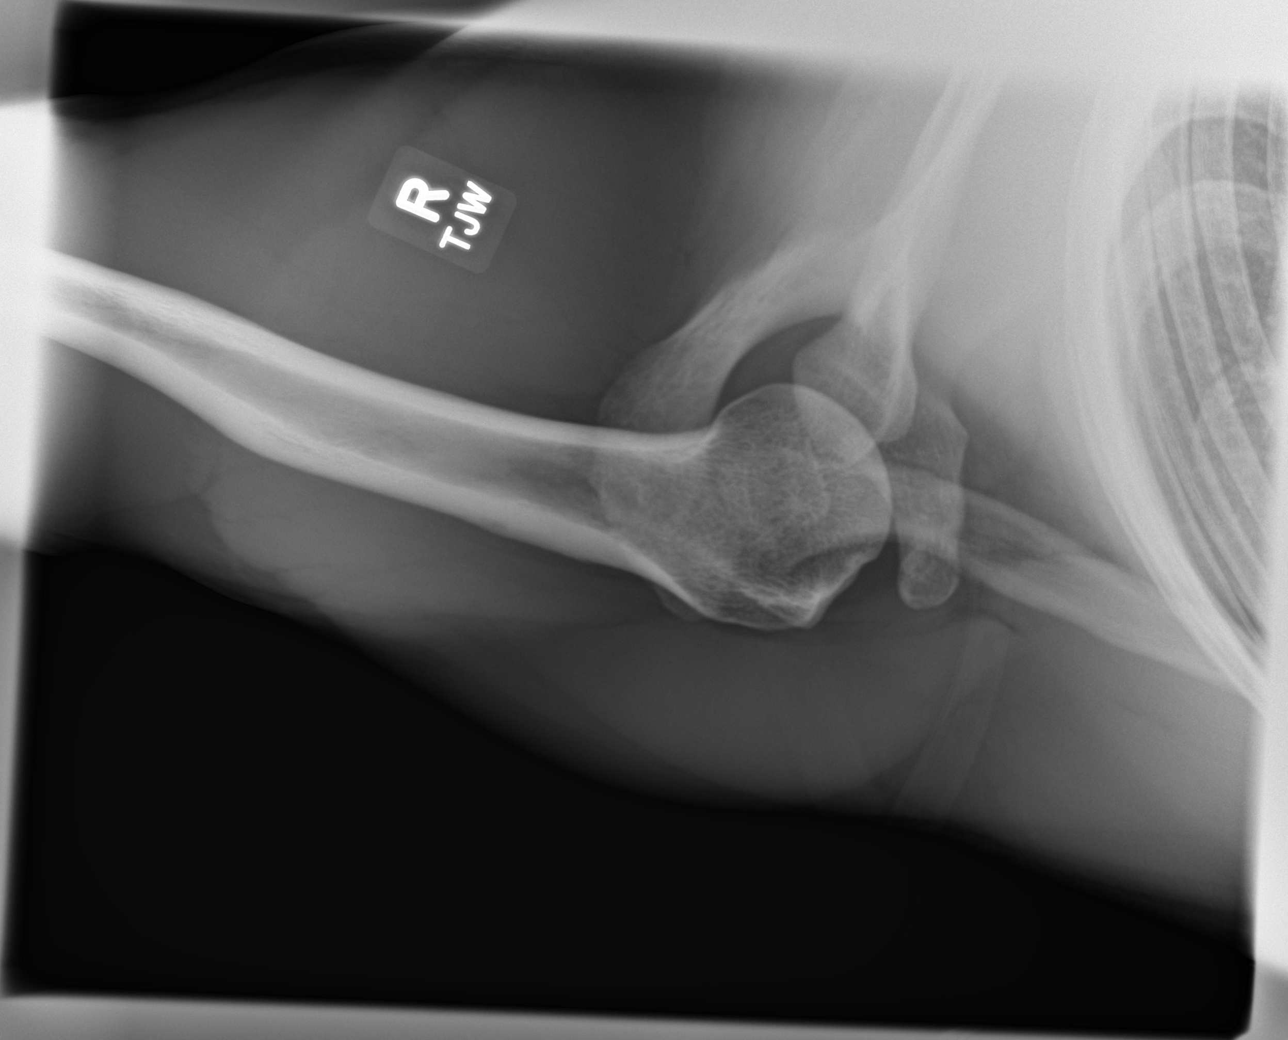

[shoulder ap]
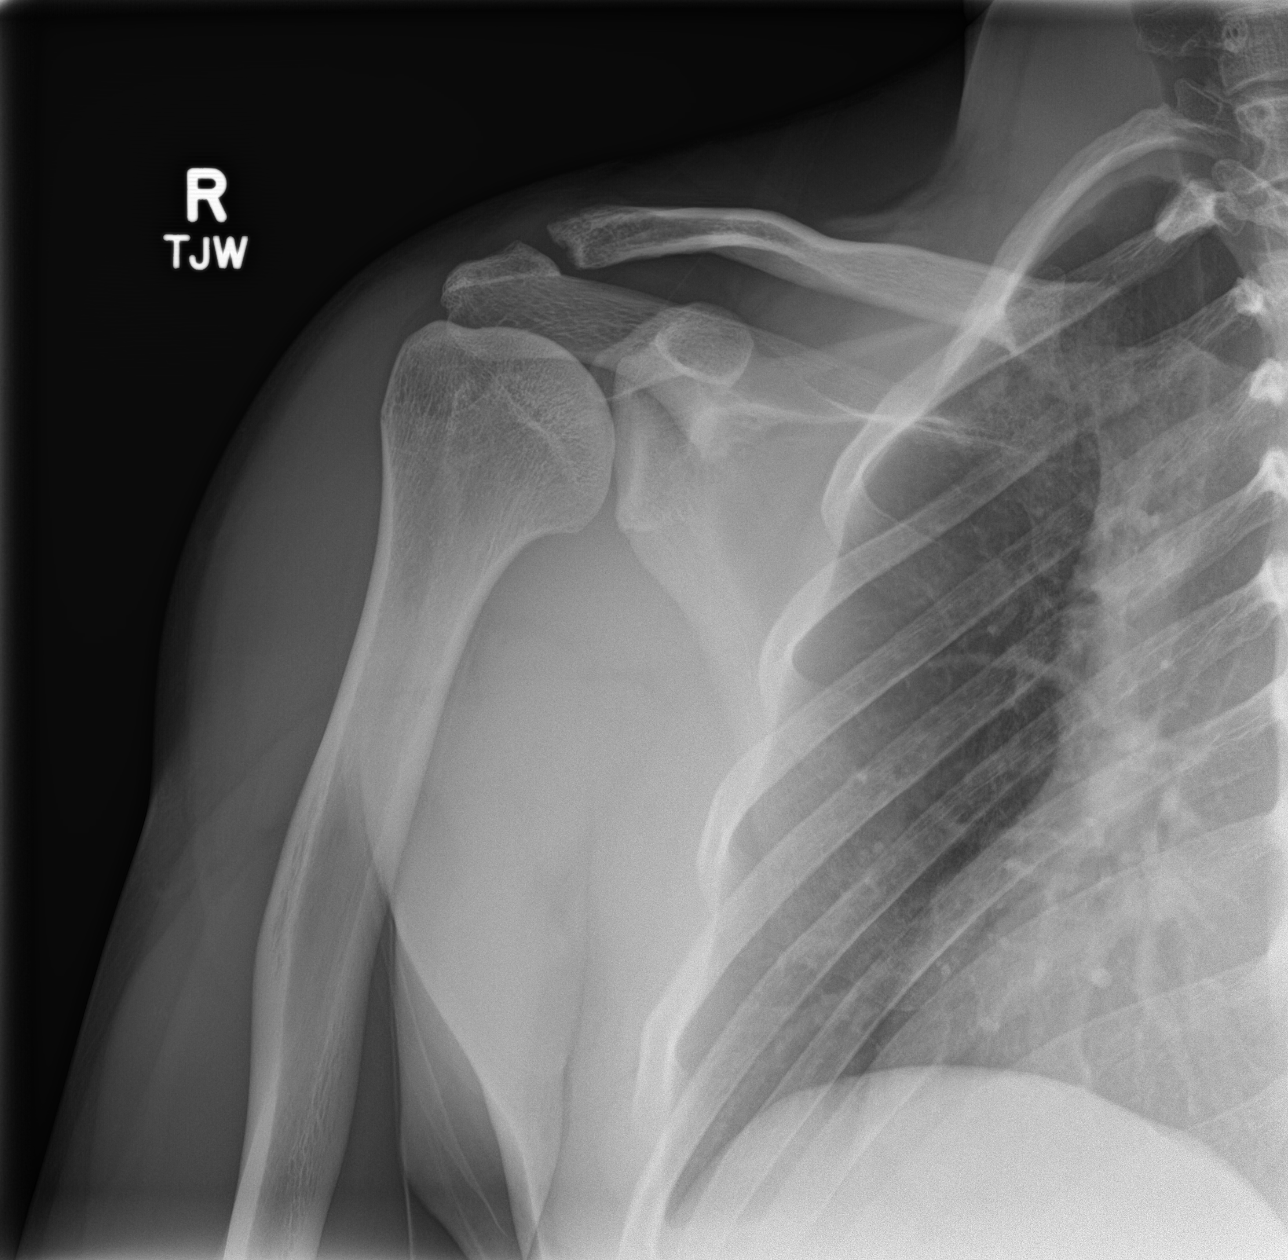

[shoulder y-view]
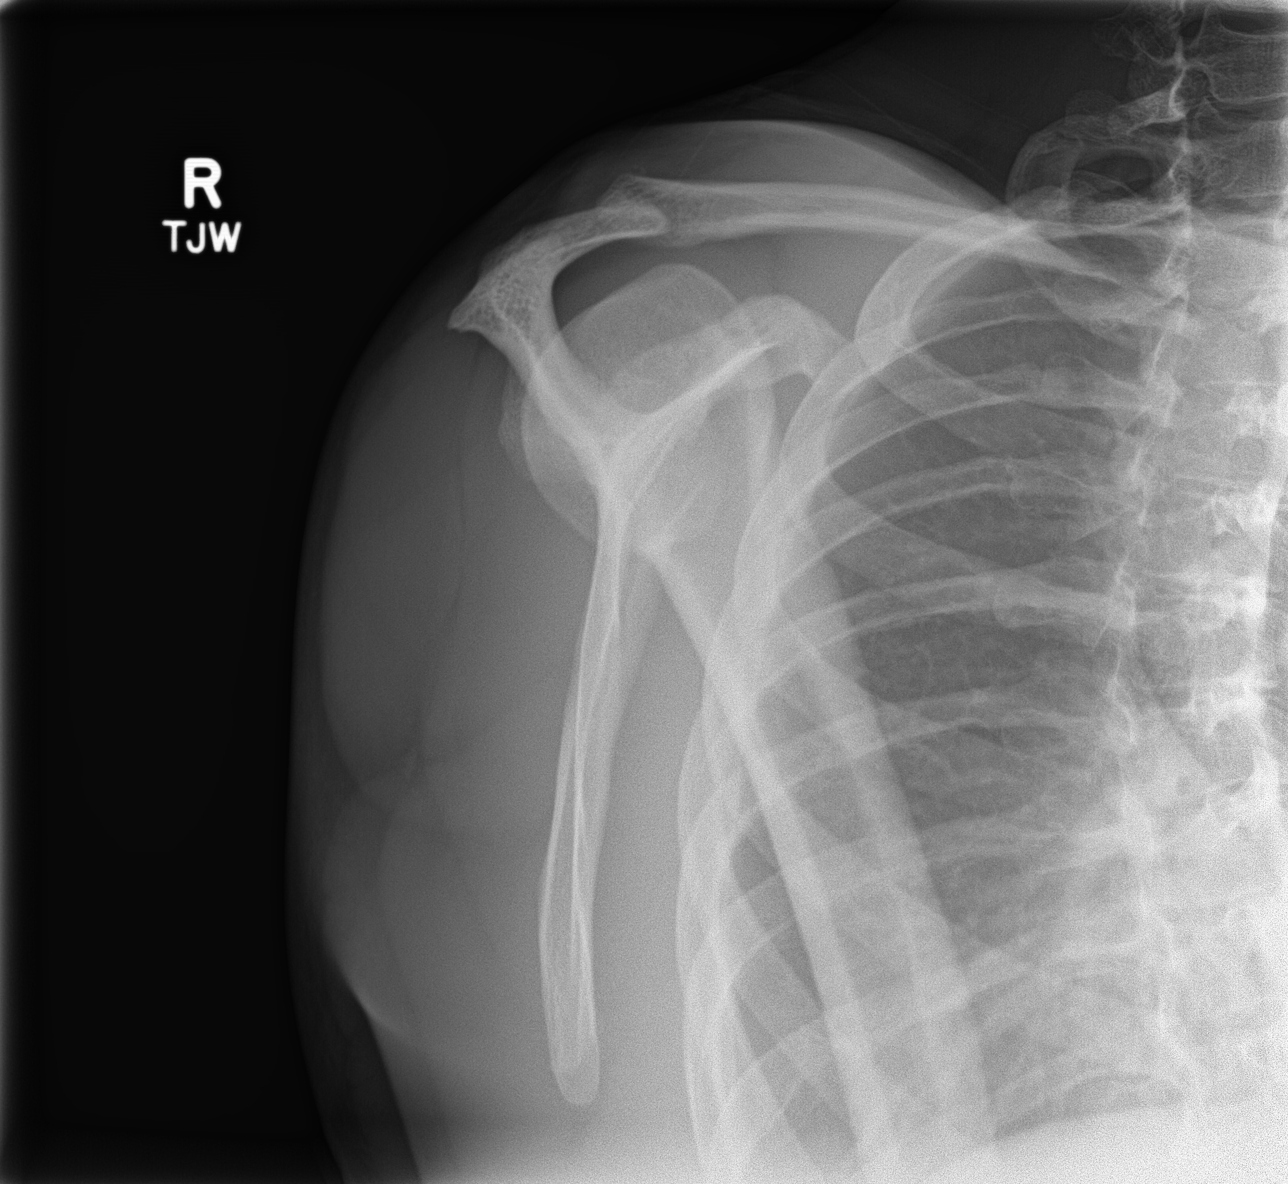

[3 of 3 positions shown; findings below may reference images not displayed]

FINDINGS: The mineralization and alignment are normal. There is no evidence of
acute fracture or dislocation. Possible minimal lucency in the
distal clavicle without AC joint widening or surrounding soft tissue
abnormality. The subacromial space is preserved.
IMPRESSION: Possible minimal lucency in the distal clavicle which could reflect
posttraumatic osteolysis. No AC joint widening or acute fracture.

## 2019-07-23 ENCOUNTER — Encounter: Payer: Self-pay | Admitting: Family Medicine

## 2019-07-23 ENCOUNTER — Other Ambulatory Visit: Payer: Self-pay

## 2019-07-23 ENCOUNTER — Telehealth: Payer: Self-pay

## 2019-07-23 ENCOUNTER — Ambulatory Visit (INDEPENDENT_AMBULATORY_CARE_PROVIDER_SITE_OTHER): Payer: 59 | Admitting: Family Medicine

## 2019-07-23 DIAGNOSIS — K120 Recurrent oral aphthae: Secondary | ICD-10-CM

## 2019-07-23 MED ORDER — DEXAMETHASONE 0.5 MG/5ML PO SOLN: 0.5000 mg | Freq: Three times a day (TID) | ORAL | 0 refills | Status: AC

## 2019-07-23 NOTE — Patient Instructions (Signed)
I would change to dexamethasone liquid and use that 3 times a day.  If not better, then let us know.   Take care.  Glad to see you.

## 2019-07-23 NOTE — Progress Notes (Signed)
This visit occurred during the SARS-CoV-2 public health emergency.  Safety protocols were in place, including screening questions prior to the visit, additional usage of staff PPE, and extensive cleaning of exam room while observing appropriate contact time as indicated for disinfecting solutions.  He had been lifting at a gym with baseline weight ~200.  Weight down in the meantime.  07/18/19 sx started with vomiting.  Not bloody vomitus.  Last fever was on 07/18/19, up to 102.  fever resolved in the meantime.  Last vomited on 07/19/19.  Last diarrhea 07/18/19.  Possibly with some bad chicken prior to sx.  Throat is still irritated, sores on his mouth. Pain with swallowing.  His oral pain is the main issue now.  No mouth like this prev.  Magic mouthwash doesn't make a huge difference.  H/o cold sores prev.  This feel similar but more painful.    Meds, vitals, and allergies reviewed.   ROS: Per HPI unless specifically indicated in ROS section   nad ncat TM wnl OP with diffuse bilateral aphthous stomatitis noted, especially on the anterior hard palate, gums of the tongue. No stridor. Neck supple. rrr ctab Normal skin turgor.  Skin well perfused.  No rash otherwise.

## 2019-07-23 NOTE — Telephone Encounter (Signed)
Appointment already made 7/12

## 2019-07-23 NOTE — Telephone Encounter (Signed)
[facility] Night - Client Nonclinical Telephone Record AccessNurse Client [facility] Night - Client Client Site [facility] - Night Physician Hannah Beat - MD Contact Type Call Who Is Calling Patient / Member / Family / Caregiver Caller Name Arthur Fleming Phone Number [phone] Patient Name Arthur Fleming Patient DOB January 10, 1996 Call Type Message Only Information Provided Reason for Call Request to Schedule Office Appointment Initial Comment Caller would like to schedule an appointment for his son. Additional Comment Provided caller with office hours and advised to call back during business hours. Caller declined triage. Caller would like an appointment today. Disp. Time Disposition Final User 07/23/2019 8:05:00 AM General Information Provided Yes Pense, Lori Call Closed By: Russ Halo Transaction Date/Time: 07/23/2019 8:02:46 AM (ET)

## 2019-07-25 ENCOUNTER — Ambulatory Visit (INDEPENDENT_AMBULATORY_CARE_PROVIDER_SITE_OTHER): Payer: 59 | Admitting: Family Medicine

## 2019-07-25 ENCOUNTER — Encounter: Payer: Self-pay | Admitting: Family Medicine

## 2019-07-25 ENCOUNTER — Other Ambulatory Visit: Payer: Self-pay

## 2019-07-25 VITALS — BP 126/82 | HR 94 | Temp 98.1°F | Ht 66.5 in | Wt 177.8 lb

## 2019-07-25 DIAGNOSIS — R21 Rash and other nonspecific skin eruption: Secondary | ICD-10-CM | POA: Diagnosis not present

## 2019-07-25 DIAGNOSIS — K120 Recurrent oral aphthae: Secondary | ICD-10-CM | POA: Insufficient documentation

## 2019-07-25 MED ORDER — VALACYCLOVIR HCL 1 G PO TABS: 1000.0000 mg | ORAL_TABLET | Freq: Three times a day (TID) | ORAL | 0 refills | Status: AC

## 2019-07-25 NOTE — Patient Instructions (Signed)
Good to see you today  I have sent in an antiviral medication to your pharmacy for possible shingles  I will notify you of lab results   Please be in touch if any worsening  Can take ibuprofen alternating with Tylenol for pain

## 2019-07-25 NOTE — Progress Notes (Signed)
   Subjective:    Patient ID: Arthur Fleming, male    DOB: 1995-01-30, 24 y.o.   MRN: [id number]  HPI Chief Complaint  Patient presents with  . Follow-up    mouth pain ulcers seen on 07-23-2019    This is a 24 yo male who presents today with mouth pain for about 7 days. Started with stomach bug with vomiting. Was seen at Urgent Care Saturday and given viscous lidocaine, little relief. Saw Dr. Para Fleming 2 days ago and was prescribed dexamethasone liquid 3x/ day with some improvement. No recent fevers, mild headache, no ear or jaw pain. No muscle or joint pain, no rash, no diarrhea, no abdominal pain.  Sexually active. No penile discharge, itching, burning, dysuria. Never been diagnosed with STI.    Review of Systems Per HPI    Objective:   Physical Exam Vitals reviewed.  Constitutional:      General: He is not in acute distress.    Appearance: Normal appearance. He is normal weight. He is not ill-appearing, toxic-appearing or diaphoretic.  HENT:     Head: Normocephalic and atraumatic.     Mouth/Throat:     Comments: Area scabbing on upper lip. Hard palate with scattered, erythematous papular/ ? Vesicular rash. Ulcerations along upper, inner gum line.  Cardiovascular:     Rate and Rhythm: Normal rate.  Pulmonary:     Effort: Pulmonary effort is normal.  Skin:    General: Skin is warm and dry.  Neurological:     Mental Status: He is alert and oriented to person, place, and time.  Psychiatric:        Mood and Affect: Mood normal.        Behavior: Behavior normal.        Thought Content: Thought content normal.        Judgment: Judgment normal.       BP 126/82   Pulse 94   Temp 98.1 F (36.7 C)   Ht 5' 6.5" (1.689 m)   Wt 177 lb 12.8 oz (80.6 kg)   SpO2 99%   BMI 28.27 kg/m  Wt Readings from Last 3 Encounters:  07/25/19 177 lb 12.8 oz (80.6 kg)  07/23/19 180 lb (81.6 kg)  05/18/18 189 lb (85.7 kg)         Assessment & Plan:  1. Rash of mouth present on  examination - unclear etiology, discussed with Dr. Selena Fleming who also examined patient.  - will check viral swabs, start antiviral for ? shinges - follow up precautions reviewed - valACYclovir (VALTREX) 1000 MG tablet; Take 1 tablet (1,000 mg total) by mouth 3 (three) times daily for 7 days.  Dispense: 21 tablet; Refill: 0 - GC/CT Probe, Amp (Throat) - Herpes simplex virus culture   Olean Ree, FNP-BC  [facility] at [facility], [facility]  07/25/2019 1:38 PM

## 2019-07-25 NOTE — Assessment & Plan Note (Signed)
I suspect he had a GI illness with acute gastroenteritis that has resolved in the meantime but since then has developed aphthous stomatitis.  I still expect him to improve.  Magic mouthwash did not help much.  He can stop that in the meantime. I would change to dexamethasone liquid and use that 3 times a day.  If not better, then he will let us know.  Still okay for outpatient follow-up.

## 2019-07-25 NOTE — Progress Notes (Deleted)
   Subjective:    Patient ID: Arthur Fleming, male    DOB: [birth year], 24 y.o.   MRN: [id number]  HPI   This is a 24 yo male who presents today with mouth pain for about 7 days. Started with stomach bug with vomiting. Was seen at Urgent Care Saturday and given viscous lidocaine, little relief. Saw Dr. Para March 2 days ago and was prescribed dexamethasone liquid 3x/ day with some improvement. No recent fevers, mild headache, no ear or jaw pain.    Review of Systems     Objective:   Physical Exam    BP 126/82   Pulse 94   Temp 98.1 F (36.7 C)   Ht 5' 6.5" (1.689 m)   Wt 177 lb 12.8 oz (80.6 kg)   SpO2 99%   BMI 28.27 kg/m  Wt Readings from Last 3 Encounters:  07/25/19 177 lb 12.8 oz (80.6 kg)  07/23/19 180 lb (81.6 kg)  05/18/18 189 lb (85.7 kg)       Assessment & Plan:

## 2019-07-30 LAB — HERPES SIMPLEX VIRUS CULTURE
MICRO NUMBER:: 10704347
SPECIMEN QUALITY:: ADEQUATE

## 2019-07-30 LAB — GC/CHLAMYDIA PROBE, AMP (THROAT)
Chlamydia trachomatis RNA: NOT DETECTED
Neisseria gonorrhoeae RNA: NOT DETECTED

## 2020-10-07 ENCOUNTER — Telehealth: Payer: Self-pay | Admitting: Family Medicine

## 2020-10-07 DIAGNOSIS — S82209D Unspecified fracture of shaft of unspecified tibia, subsequent encounter for closed fracture with routine healing: Secondary | ICD-10-CM

## 2020-10-07 DIAGNOSIS — Z9889 Other specified postprocedural states: Secondary | ICD-10-CM

## 2020-10-07 DIAGNOSIS — Q780 Osteogenesis imperfecta: Secondary | ICD-10-CM

## 2020-10-07 DIAGNOSIS — S82409D Unspecified fracture of shaft of unspecified fibula, subsequent encounter for closed fracture with routine healing: Secondary | ICD-10-CM

## 2020-10-07 NOTE — Telephone Encounter (Signed)
Can you call Layken or his mom  I know that journee has seen a number of people at [facility] and [facility] recently Dr. Ivin Poot.  Do they want to get set up with someone in [place]?

## 2020-10-07 NOTE — Telephone Encounter (Signed)
Mother sent below [employer]. Will forward to PCP  "My son, Arthur Fleming, is undergoing surgery for a bad tib/fib fracture in right leg in [place]. After surgery, we will bring him home. Moving forward, we will need follow up appointment with a local orthopedic surgeon and eventually a physical therapist. Can your office assist [place] with this? Raiford Noble"

## 2020-10-07 NOTE — Telephone Encounter (Signed)
Spoke with Mrs. Mione.  They would prefer [facility].

## 2020-10-09 NOTE — Telephone Encounter (Signed)
Can you tell Arthur Fleming (or his mom) that I have put in a consult with [facility].  Surgical [place] from [place] and with OI is complicated.  Can you please ask when his surgery was and when he will be coming home.  This makes arranging follow-up complicated.

## 2020-10-09 NOTE — Telephone Encounter (Signed)
Left message for Mrs. Lippy to return call.  Dr. Patsy Lager is going to set up consults with [facility] for [facility] but he would like to know the date Tabius had surgery and the date they will be returning home so we can make those arrangements.

## 2020-10-09 NOTE — Addendum Note (Signed)
Addended by: Hannah Beat on: 10/09/2020 12:22 PM   Modules accepted: Orders

## 2020-10-10 ENCOUNTER — Other Ambulatory Visit: Payer: Self-pay | Admitting: Family Medicine

## 2020-10-10 MED ORDER — OXYCODONE-ACETAMINOPHEN 5-325 MG PO TABS: 1.0000 | ORAL_TABLET | ORAL | 0 refills | Status: AC | PRN

## 2020-10-10 NOTE — Telephone Encounter (Signed)
See my other phone note.

## 2020-10-10 NOTE — Telephone Encounter (Signed)
Received mychart message from patient's mom stating patient will be out of the medication tomorrow. Please review thank you.

## 2020-10-10 NOTE — Telephone Encounter (Signed)
Pt had surgery on 10/04/20 and returned home 9/25//22. Pt mom would like to the appt set up for [facility].

## 2020-10-10 NOTE — Telephone Encounter (Signed)
Reviewed Chart. Pt with osteogenesis imperfecta and recent surgery in [place] on 10/04/20 now back in this area.   Has no further pain meds.  Referral to [facility] in progress per office notes.  Previewed PDMP and no red flags.  PCP not in office today.   Attempted contact  to PCP  for refilled given controlled substance but not able to reach.  I will prescribe oxycodone # 30 to last thorough weekend. Mother contacted by CMA via Mychart. He needs  follow up with PCP for further refills going forward.

## 2020-10-10 NOTE — Telephone Encounter (Addendum)
Last OV- 07/25/2019 Next OV- N/A Last Filled -10/05/2020

## 2020-10-10 NOTE — Telephone Encounter (Signed)
Arthur Fleming was not available, but I talked to his Mom who I know well.  He is a well-known patient since childhood who sustained a R sided Tib-Fib fx and IM tibial rod placed on 10/04/2020. He has osteogenesis imperfecta, and he has had multiple fractures in the past.  The R tib/fib has never been broken in the past.  I spoke to his Mom, and his parents brought him home last Sunday.  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  He is in significant pain, and my partner sent him in some oxycodone.  I have no concerns in this case.  He has been sparing with his narcotic use during fractures in the past.   He will need post-operative fracture care, but the operating surgeon is 6 hours away, and they need for him to get set up with a local orthopedist.  I am going to help coordinate next week.  [facility] at [facility] managed his care when he was younger.

## 2020-10-10 NOTE — Telephone Encounter (Signed)
  Encourage patient to contact the pharmacy for refills or they can request refills through [facility]  LAST APPOINTMENT DATE:  Please schedule appointment if longer than 1 year  NEXT APPOINTMENT DATE:no future appointment  MEDICATION:Oxycodone-acetaminophen 5-325  Is the patient out of medication? Yes  PHARMACY:[id number] - [place], [place] - [address]  Let patient know to contact pharmacy at the end of the day to make sure medication is ready.  Please notify patient to allow 48-72 hours to process  CLINICAL FILLS OUT ALL BELOW:   LAST REFILL:  QTY:  REFILL DATE:    OTHER COMMENTS:    Okay for refill?  Please advise

## 2020-10-13 NOTE — Telephone Encounter (Signed)
I just called Arthur Fleming and [facility].  I was able to speak to his mother.   Our care coordinators asked [place] to arrange operative records prior to surgical appointment.  Mom or Dad is going to reach out to the surgeon in [facility], and they plan on physically driving the operative records to [facility].

## 2020-10-13 NOTE — Telephone Encounter (Signed)
I was able to speak to Arthur Fleming at [facility] about the case, and they graciously were able to get an appointment for Arthur Fleming with Dr. Blanchie Dessert tomorrow, 10/14/2020, at 3:15 PM.  I left messages on the patient's cell phone, his mother's phone, and his father's phone was busy.

## 2020-10-14 NOTE — Telephone Encounter (Signed)
Confirmed with Mother, appt known.  They know to go early, and father has operative reports to physically hand to [facility].

## 2022-08-31 ENCOUNTER — Telehealth: Payer: Self-pay | Admitting: Family Medicine

## 2022-08-31 NOTE — Telephone Encounter (Signed)
Patient was last seen in 2023- left voicemail for patient to schedule a physical
